# Patient Record
Sex: Female | Born: 1960 | Race: Black or African American | Hispanic: No | Marital: Married
Health system: Southern US, Community
[De-identification: ages and names within clinical notes are randomized; demographics above are authoritative.]

---

## 2016-06-02 ENCOUNTER — Encounter (HOSPITAL_COMMUNITY): Payer: Self-pay | Admitting: Emergency Medicine

## 2016-06-02 ENCOUNTER — Emergency Department (HOSPITAL_COMMUNITY)
Admission: EM | Admit: 2016-06-02 | Discharge: 2016-06-02 | Disposition: A | Discharge status: Home / Self Care | Payer: Self-pay | Attending: Emergency Medicine | Admitting: Emergency Medicine

## 2016-06-02 ENCOUNTER — Emergency Department (HOSPITAL_COMMUNITY): Payer: Self-pay

## 2016-06-02 DIAGNOSIS — N390 Urinary tract infection, site not specified: Secondary | ICD-10-CM | POA: Insufficient documentation

## 2016-06-02 DIAGNOSIS — M79601 Pain in right arm: Secondary | ICD-10-CM | POA: Insufficient documentation

## 2016-06-02 DIAGNOSIS — R42 Dizziness and giddiness: Secondary | ICD-10-CM | POA: Insufficient documentation

## 2016-06-02 LAB — CBC
HCT: 38.8 % (ref 36.0–46.0)
Hemoglobin: 12.4 g/dL (ref 12.0–15.0)
MCH: 27.8 pg (ref 26.0–34.0)
MCHC: 32 g/dL (ref 30.0–36.0)
MCV: 87 fL (ref 78.0–100.0)
Platelets: 393 10*3/uL (ref 150–400)
RBC: 4.46 MIL/uL (ref 3.87–5.11)
RDW: 13.4 % (ref 11.5–15.5)
WBC: 8.6 10*3/uL (ref 4.0–10.5)

## 2016-06-02 LAB — URINALYSIS, ROUTINE W REFLEX MICROSCOPIC
BILIRUBIN URINE: NEGATIVE
Glucose, UA: NEGATIVE mg/dL
HGB URINE DIPSTICK: NEGATIVE
KETONES UR: NEGATIVE mg/dL
Nitrite: NEGATIVE
Protein, ur: NEGATIVE mg/dL
SPECIFIC GRAVITY, URINE: 1.026 (ref 1.005–1.030)
pH: 5 (ref 5.0–8.0)

## 2016-06-02 LAB — BASIC METABOLIC PANEL
Anion gap: 11 (ref 5–15)
BUN: 17 mg/dL (ref 6–20)
CALCIUM: 9.3 mg/dL (ref 8.9–10.3)
CHLORIDE: 103 mmol/L (ref 101–111)
CO2: 24 mmol/L (ref 22–32)
CREATININE: 0.86 mg/dL (ref 0.44–1.00)
GFR calc non Af Amer: >60 mL/min (ref >60)
GLUCOSE: 124 mg/dL — AB (ref 65–99)
Potassium: 3.8 mmol/L (ref 3.5–5.1)
Sodium: 138 mmol/L (ref 135–145)

## 2016-06-02 LAB — I-STAT TROPONIN, ED: Troponin i, poc: 0 ng/mL (ref 0.00–0.08)

## 2016-06-02 LAB — I-STAT BETA HCG BLOOD, ED (MC, WL, AP ONLY)

## 2016-06-02 MED ORDER — KETOROLAC TROMETHAMINE 60 MG/2ML IM SOLN
30.0000 mg | Freq: Once | INTRAMUSCULAR | Status: AC
  Administered 2016-06-02: 30 mg via INTRAMUSCULAR
  Filled 2016-06-02: qty 2

## 2016-06-02 MED ORDER — CEPHALEXIN 500 MG PO CAPS: 500.0000 mg | ORAL_CAPSULE | Freq: Two times a day (BID) | ORAL | 0 refills | Status: AC

## 2016-06-02 MED ORDER — CEPHALEXIN 250 MG PO CAPS
500.0000 mg | ORAL_CAPSULE | Freq: Once | ORAL | Status: AC
  Administered 2016-06-02: 500 mg via ORAL
  Filled 2016-06-02: qty 2

## 2016-06-02 NOTE — ED Provider Notes (Signed)
[facility]-EMERGENCY DEPT Provider Note   CSN: [id number] Arrival date & time: 06/02/16  1826     History   Chief Complaint Chief Complaint  Patient presents with  . Arm Pain  . Dizziness    HPI Laura Larsen is a 56 y.o. female.  The history is provided by the patient.  Extremity Pain  This is a new problem. The current episode started 6 to 12 hours ago. The problem occurs constantly. The problem has not changed since onset.Pertinent negatives include no chest pain, no abdominal pain, no headaches and no shortness of breath. Nothing aggravates the symptoms. Nothing relieves the symptoms. She has tried nothing for the symptoms. The treatment provided no relief.    History reviewed. No pertinent past medical history.  There are no active problems to display for this patient.   History reviewed. No pertinent surgical history.  OB History    No data available       Home Medications    Prior to Admission medications   Medication Sig Start Date End Date Taking? Authorizing Provider  cephALEXin (KEFLEX) 500 MG capsule Take 1 capsule (500 mg total) by mouth 2 (two) times daily. 06/02/16 06/09/16  Stacy Gardner, MD    Family History No family history on file.  Social History Social History  Substance Use Topics  . Smoking status: Not on file  . Smokeless tobacco: Not on file  . Alcohol use Not on file     Allergies   Sulfa antibiotics   Review of Systems Review of Systems  Constitutional: Negative for chills and fever.  HENT: Negative for ear pain and sore throat.   Eyes: Negative for pain and visual disturbance.  Respiratory: Negative for cough and shortness of breath.   Cardiovascular: Negative for chest pain and palpitations.  Gastrointestinal: Negative for abdominal pain and vomiting.  Genitourinary: Negative for dysuria and hematuria.  Musculoskeletal: Positive for arthralgias and myalgias. Negative for back pain, neck pain and neck stiffness.  Skin:  Negative for color change and rash.  Neurological: Positive for light-headedness. Negative for seizures, syncope and headaches.  All other systems reviewed and are negative.    Physical Exam Updated Vital Signs BP (!) 150/92   Pulse 84   Temp 98.6 F (37 C) (Oral)   Resp 16   Ht 5' 5.5" (1.664 m)   Wt 85.7 kg   SpO2 98%   BMI 30.97 kg/m   Physical Exam  Constitutional: She appears well-developed and well-nourished. No distress.  HENT:  Head: Normocephalic and atraumatic.  Eyes: Conjunctivae are normal.  Neck: Neck supple.  Cardiovascular: Normal rate and regular rhythm.   No murmur heard. Pulmonary/Chest: Effort normal and breath sounds normal. No respiratory distress.  Abdominal: Soft. There is no tenderness.  Musculoskeletal: She exhibits no edema.       Right shoulder: She exhibits tenderness. She exhibits normal range of motion, no swelling, no effusion, no crepitus and no deformity.       Right elbow: She exhibits normal range of motion, no swelling and no effusion. Tenderness found.  Neurological: She is alert. She has normal strength. No cranial nerve deficit or sensory deficit. Coordination normal. GCS eye subscore is 4. GCS verbal subscore is 5. GCS motor subscore is 6.  Reflex Scores:      Tricep reflexes are 2+ on the right side and 2+ on the left side. Skin: Skin is warm and dry.  Psychiatric: She has a normal mood and affect.  Nursing note and  vitals reviewed.    ED Treatments / Results  Labs (all labs ordered are listed, but only abnormal results are displayed) Labs Reviewed  BASIC METABOLIC PANEL - Abnormal; Notable for the following:       Result Value   Glucose, Bld 124 (*)    All other components within normal limits  URINALYSIS, ROUTINE W REFLEX MICROSCOPIC - Abnormal; Notable for the following:    APPearance HAZY (*)    Leukocytes, UA LARGE (*)    Bacteria, UA RARE (*)    Squamous Epithelial / LPF 6-30 (*)    Non Squamous Epithelial 0-5 (*)      All other components within normal limits  CBC  I-STAT BETA HCG BLOOD, ED ([facility], [facility], [facility] ONLY)  I-STAT TROPOININ, ED  I-STAT TROPOININ, ED    EKG  EKG Interpretation  Date/Time:  Saturday June 02 2016 18:30:51 EDT Ventricular Rate:  85 PR Interval:  154 QRS Duration: 82 QT Interval:  388 QTC Calculation: 461 R Axis:     Text Interpretation:  Normal sinus rhythm with sinus arrhythmia Possible Left atrial enlargement No previous tracing Confirmed by Anitra Lauth  MD, Alphonzo Lemmings ([id number]) on 06/02/2016 6:57:15 PM       Radiology Dg Chest 2 View  Result Date: 06/02/2016 CLINICAL DATA:  Patient reports she had a dizzy "spell" yesterday, reports she started to have right side arm pain that started at her shoulder and radiated down her right arm. Patient reports her blood pressure was high today. EXAM: CHEST  2 VIEW COMPARISON:  None. FINDINGS: Cardiac silhouette is normal in size. No mediastinal or hilar masses. No evidence of adenopathy. There are mildly prominent bronchovascular markings. No evidence of pneumonia or pulmonary edema. No pleural effusion or pneumothorax. The skeletal structures are intact. IMPRESSION: No active cardiopulmonary disease. Electronically Signed   By: Amie Portland M.D.   On: 06/02/2016 19:59   Dg Shoulder Right  Result Date: 06/02/2016 CLINICAL DATA:  Patient reports she had a dizzy "spell" yesterday, reports she started to have right side arm pain that started at her shoulder and radiated down her right arm. Patient reports her blood pressure was high today. No known injury to right shoulder. EXAM: RIGHT SHOULDER - 2+ VIEW COMPARISON:  None. FINDINGS: There is no evidence of fracture or dislocation. There is no evidence of arthropathy or other focal bone abnormality. Soft tissues are unremarkable. IMPRESSION: Negative. Electronically Signed   By: Amie Portland M.D.   On: 06/02/2016 20:00    Procedures Procedures (including critical care time)  Medications Ordered in  ED Medications  cephALEXin (KEFLEX) capsule 500 mg (not administered)  ketorolac (TORADOL) injection 30 mg (30 mg Intramuscular Given 06/02/16 1922)     Initial Impression / Assessment and Plan / ED Course  I have reviewed the triage vital signs and the nursing notes.  Pertinent labs & imaging results that were available during my care of the patient were reviewed by me and considered in my medical decision making (see chart for details).     56 year old female with no significant past medical history presents in the setting of lightheadedness and right arm pain. Patient reports yesterday today she has some mild lightheadedness. She reports that has resolved. She reports today she had right-sided arm pain. She reports nothing makes it better or worse. She reports is a deep tender pain in her right arm. Patient denies any numbness or tingling. Patient denies any recent trauma or overuse.  On arrival patient was hematemesis  stable and afebrile. Examination patient neurovascular intact and otherwise resting comfortably. Low suspicion for ACS as patient was worried about a possible heart attack. No signs of CVA or TIA. Negative Spurling's test no signs of radicular pain. No recent trauma to the neck and do not believe imaging is necessary. Chest x-ray and x-ray of shoulder without significant abnormality including fracture or malalignment.. Initial troponin negative. EKG with normal sinus rhythm with no signs of acute ischemia, ST segment elevation or depression. No electrolyte abdomen is noted. Urinalysis concerning for urinary tract infection. At this time believe this is likely urinary tract infection with referred pain to right shoulder. No signs of of pyelonephritis.  We'll discharge home with plan for over the counter pain medication and Keflex for UTI. Patient to follow up with PCP for further management of condition. Strict return cautions given a patient stable at time of discharge.  Final  Clinical Impressions(s) / ED Diagnoses   Final diagnoses:  Acute UTI  Right arm pain  Lightheadedness    New Prescriptions New Prescriptions   CEPHALEXIN (KEFLEX) 500 MG CAPSULE    Take 1 capsule (500 mg total) by mouth 2 (two) times daily.     Stacy Gardner, MD 06/02/16 1610    Gwyneth Sprout, MD 06/03/16 [phone]

## 2016-06-02 NOTE — ED Triage Notes (Signed)
Pt states yesterday she woke up lightheaded, today she c/o R arm pain from the shoulder down. Denies CP, SOB.

## 2018-11-03 IMAGING — CR DG CHEST 2V
2 series · 2 of 2 positions shown · non-contrast
Comparison: None.

CLINICAL DATA: Patient reports she had a dizzy "Amor" yesterday,
reports she started to have right side arm pain that started at her
shoulder and radiated down her right arm. Patient reports her blood
pressure was high today.

EXAM:
CHEST  2 VIEW

[chest pa]
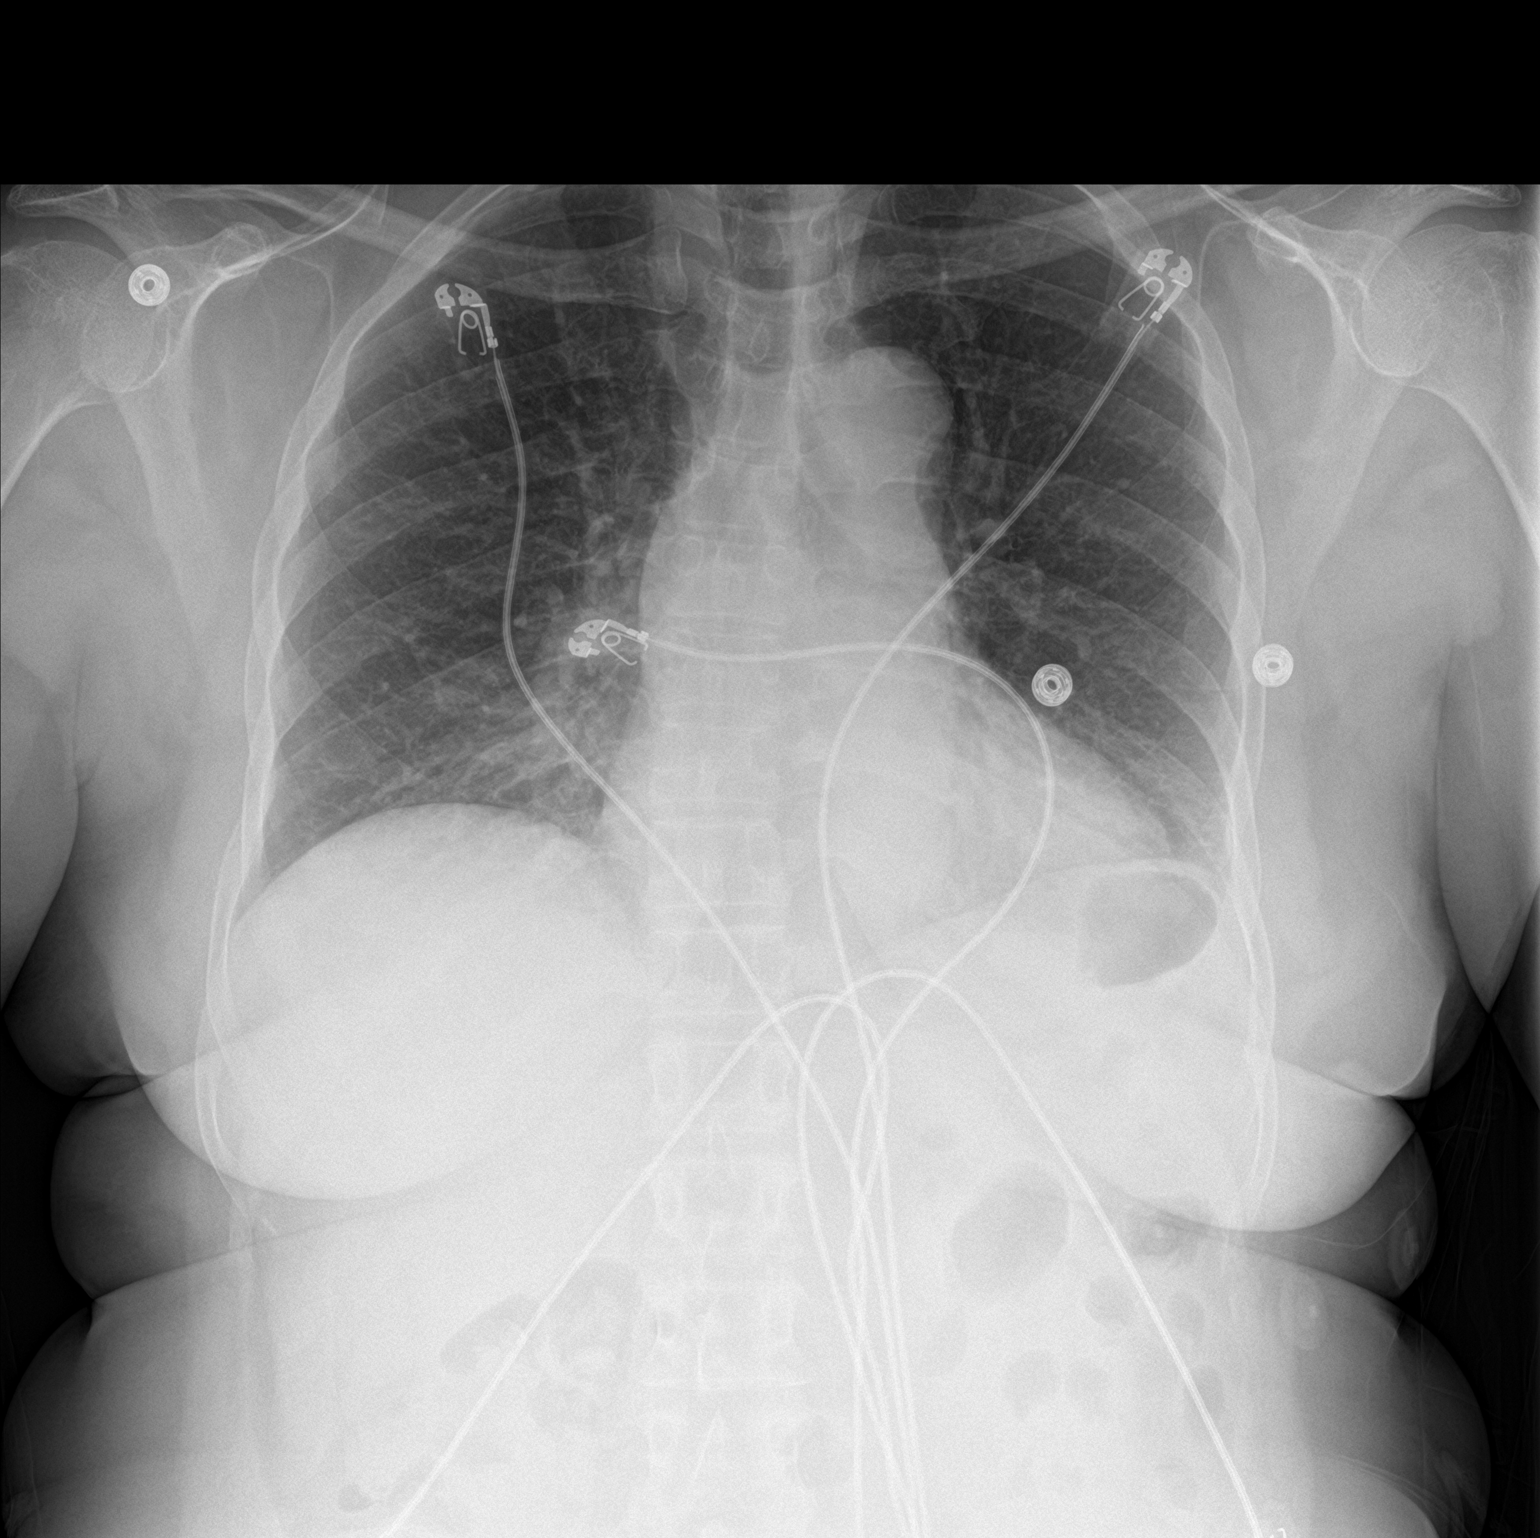

[chest lat]
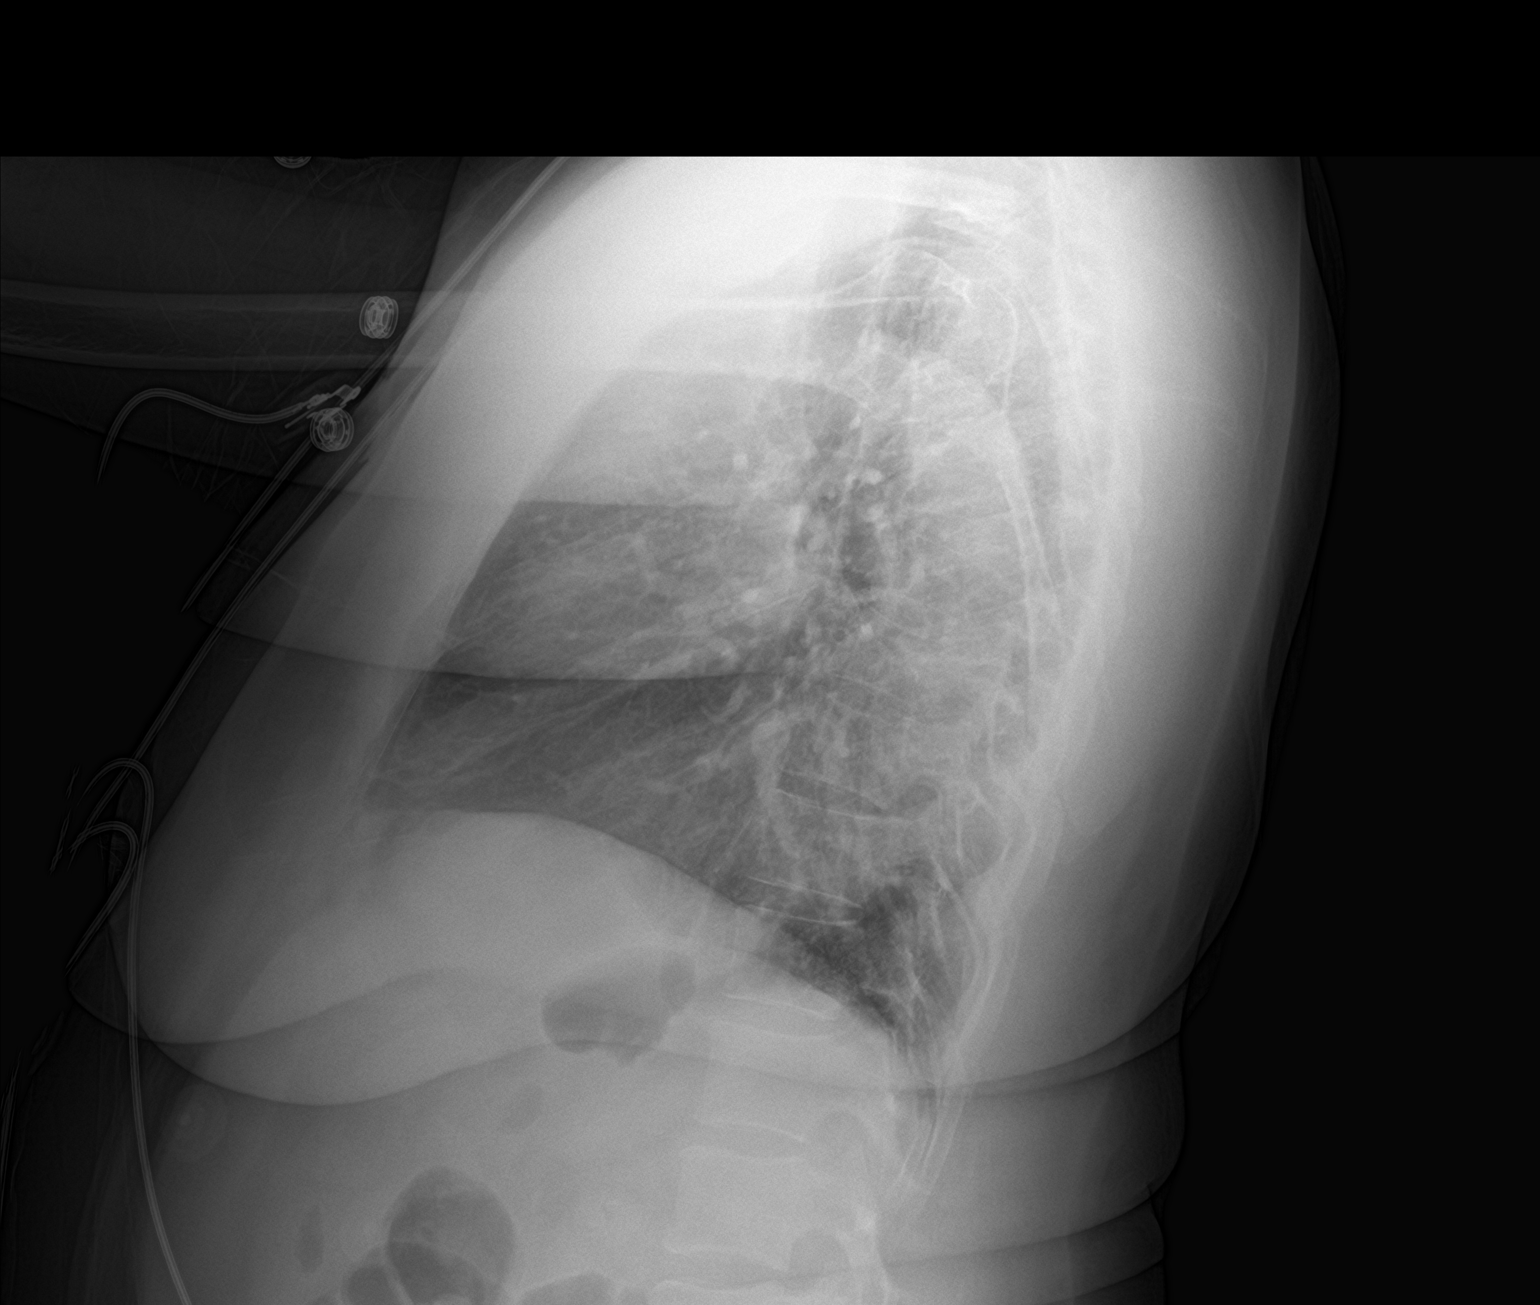

[2 of 2 positions shown; findings below may reference images not displayed]

FINDINGS: Cardiac silhouette is normal in size. No mediastinal or hilar
masses. No evidence of adenopathy.

There are mildly prominent bronchovascular markings. No evidence of
pneumonia or pulmonary edema. No pleural effusion or pneumothorax.

The skeletal structures are intact.
IMPRESSION: No active cardiopulmonary disease.
